# Patient Record
Sex: Male | Born: 1995 | Race: Black or African American | Hispanic: No | Marital: Single | Smoking: Never smoker
Health system: Southern US, Community
[De-identification: ages and names within clinical notes are randomized; demographics above are authoritative.]

## PROBLEM LIST (undated history)

## (undated) DIAGNOSIS — J45909 Unspecified asthma, uncomplicated: Secondary | ICD-10-CM

## (undated) HISTORY — PX: HERNIA REPAIR: SHX51

---

## 2017-07-12 ENCOUNTER — Emergency Department (HOSPITAL_COMMUNITY)
Admission: EM | Admit: 2017-07-12 | Discharge: 2017-07-12 | Disposition: A | Discharge status: Home / Self Care | Payer: BLUE CROSS/BLUE SHIELD | Attending: Emergency Medicine | Admitting: Emergency Medicine

## 2017-07-12 ENCOUNTER — Emergency Department (HOSPITAL_COMMUNITY): Payer: BLUE CROSS/BLUE SHIELD

## 2017-07-12 ENCOUNTER — Encounter (HOSPITAL_COMMUNITY): Payer: Self-pay | Admitting: Emergency Medicine

## 2017-07-12 DIAGNOSIS — N200 Calculus of kidney: Secondary | ICD-10-CM | POA: Insufficient documentation

## 2017-07-12 DIAGNOSIS — N201 Calculus of ureter: Secondary | ICD-10-CM | POA: Diagnosis not present

## 2017-07-12 DIAGNOSIS — R110 Nausea: Secondary | ICD-10-CM | POA: Diagnosis not present

## 2017-07-12 DIAGNOSIS — N23 Unspecified renal colic: Secondary | ICD-10-CM

## 2017-07-12 DIAGNOSIS — R109 Unspecified abdominal pain: Secondary | ICD-10-CM

## 2017-07-12 DIAGNOSIS — R319 Hematuria, unspecified: Secondary | ICD-10-CM | POA: Insufficient documentation

## 2017-07-12 DIAGNOSIS — R1031 Right lower quadrant pain: Secondary | ICD-10-CM | POA: Diagnosis present

## 2017-07-12 HISTORY — DX: Unspecified asthma, uncomplicated: J45.909

## 2017-07-12 LAB — URINALYSIS, ROUTINE W REFLEX MICROSCOPIC
Bacteria, UA: NONE SEEN
Bilirubin Urine: NEGATIVE
GLUCOSE, UA: NEGATIVE mg/dL
Ketones, ur: NEGATIVE mg/dL
Leukocytes, UA: NEGATIVE
Nitrite: NEGATIVE
Protein, ur: 100 mg/dL — AB
Specific Gravity, Urine: 1.026 (ref 1.005–1.030)
pH: 6 (ref 5.0–8.0)

## 2017-07-12 MED ORDER — HYDROCODONE-ACETAMINOPHEN 5-325 MG PO TABS
1.0000 | ORAL_TABLET | Freq: Once | ORAL | Status: AC
  Administered 2017-07-12: 1 via ORAL
  Filled 2017-07-12: qty 1

## 2017-07-12 MED ORDER — ONDANSETRON 4 MG PO TBDP: 4.0000 mg | ORAL_TABLET | Freq: Three times a day (TID) | ORAL | 0 refills | Status: AC | PRN

## 2017-07-12 MED ORDER — NAPROXEN 250 MG PO TABS: 250.0000 mg | ORAL_TABLET | Freq: Two times a day (BID) | ORAL | 0 refills | Status: AC

## 2017-07-12 MED ORDER — ONDANSETRON 8 MG PO TBDP
8.0000 mg | ORAL_TABLET | Freq: Once | ORAL | Status: AC
  Administered 2017-07-12: 8 mg via ORAL
  Filled 2017-07-12: qty 1

## 2017-07-12 MED ORDER — HYDROCODONE-ACETAMINOPHEN 5-325 MG PO TABS: ORAL_TABLET | ORAL | 0 refills | Status: AC

## 2017-07-12 NOTE — ED Provider Notes (Signed)
[facility]-EMERGENCY DEPT Provider Note   CSN: [id number] Arrival date & time: 07/12/17  0736     History   Chief Complaint Chief Complaint  Patient presents with  . Flank Pain    HPI Joseph Irwin is a 22 y.o. male.  HPI Pt was seen at 0955. Per pt, c/o sudden onset and resolution of one episode of right sided flank "pain" that began this morning at 0500 PTA.  Pt describes the pain as "sharp," and radiating into the right side of his abd and groin areas.  Has been associated with nausea and hematuria. Pt took an ibuprofen with partial improvement of his pain. Denies testicular pain/swelling, no dysuria/hematuria, no abd pain, no vomiting/diarrhea, no black or blood in stools, no CP/SOB, no fevers, no rash.    Past Medical History:  Diagnosis Date  . Asthma     There are no active problems to display for this patient.   Past Surgical History:  Procedure Laterality Date  . HERNIA REPAIR          Home Medications    Prior to Admission medications   Not on File    Family History No family history on file.  Social History Social History   Tobacco Use  . Smoking status: Never Smoker  . Smokeless tobacco: Never Used  Substance Use Topics  . Alcohol use: Not Currently    Frequency: Never  . Drug use: Never     Allergies   Penicillins   Review of Systems Review of Systems ROS: Statement: All systems negative except as marked or noted in the HPI; Constitutional: Negative for fever and chills. ; ; Eyes: Negative for eye pain, redness and discharge. ; ; ENMT: Negative for ear pain, hoarseness, nasal congestion, sinus pressure and sore throat. ; ; Cardiovascular: Negative for chest pain, palpitations, diaphoresis, dyspnea and peripheral edema. ; ; Respiratory: Negative for cough, wheezing and stridor. ; ; Gastrointestinal: Negative for nausea, vomiting, diarrhea, abdominal pain, blood in stool, hematemesis, jaundice and rectal bleeding. . ;  ; Genitourinary: Negative for dysuria, +flank pain and hematuria. ; ; Genital:  No penile drainage or rash, no testicular pain or swelling, no scrotal rash or swelling. ;; Musculoskeletal: Negative for back pain and neck pain. Negative for swelling and trauma.; ; Skin: Negative for pruritus, rash, abrasions, blisters, bruising and skin lesion.; ; Neuro: Negative for headache, lightheadedness and neck stiffness. Negative for weakness, altered level of consciousness, altered mental status, extremity weakness, paresthesias, involuntary movement, seizure and syncope.       Physical Exam Updated Vital Signs BP 128/84 (BP Location: Right Arm)   Pulse 74   Temp 98 F (36.7 C) (Oral)   Ht 5\' 10"  (1.778 m)   Wt 74.8 kg (165 lb)   SpO2 100%   BMI 23.68 kg/m   Physical Exam 1000: Physical examination:  Nursing notes reviewed; Vital signs and O2 SAT reviewed;  Constitutional: Well developed, Well nourished, Well hydrated, In no acute distress; Head:  Normocephalic, atraumatic; Eyes: EOMI, PERRL, No scleral icterus; ENMT: Mouth and pharynx normal, Mucous membranes moist; Neck: Supple, Full range of motion, No lymphadenopathy; Cardiovascular: Regular rate and rhythm, No gallop; Respiratory: Breath sounds clear & equal bilaterally, No wheezes.  Speaking full sentences with ease, Normal respiratory effort/excursion; Chest: Nontender, Movement normal; Abdomen: Soft, Nontender, Nondistended, Normal bowel sounds; Genitourinary: No CVA tenderness. Genital exam performed with pt permission and ED RN chaperone present during exam. No perineal erythema.  No penile lesions  or drainage.  No scrotal erythema, edema or tenderness to palp.  Normal testicular lie.  No testicular tenderness to palp.  +cremasteric reflexes bilat.  No inguinal LAN or palpable masses.;;; Spine:  No midline CS, TS, LS tenderness.;; Extremities: Peripheral pulses normal, No tenderness, No edema, No calf edema or asymmetry.; Neuro: AA&Ox3, Major CN  grossly intact.  Speech clear. No gross focal motor or sensory deficits in extremities. Climbs on and off stretcher easily by himself. Gait steady..; Skin: Color normal, Warm, Dry.   ED Treatments / Results  Labs (all labs ordered are listed, but only abnormal results are displayed)   EKG None  Radiology   Procedures Procedures (including critical care time)  Medications Ordered in ED Medications  ondansetron (ZOFRAN-ODT) disintegrating tablet 8 mg (has no administration in time range)  HYDROcodone-acetaminophen (NORCO/VICODIN) 5-325 MG per tablet 1 tablet (has no administration in time range)     Initial Impression / Assessment and Plan / ED Course  I have reviewed the triage vital signs and the nursing notes.  Pertinent labs & imaging results that were available during my care of the patient were reviewed by me and considered in my medical decision making (see chart for details).  MDM Reviewed: previous chart, nursing note and vitals Interpretation: labs and CT scan    Results for orders placed or performed during the hospital encounter of 07/12/17  Urinalysis, Routine w reflex microscopic- may I&O cath if menses  Result Value Ref Range   Color, Urine RED (A) YELLOW   APPearance CLOUDY (A) CLEAR   Specific Gravity, Urine 1.026 1.005 - 1.030   pH 6.0 5.0 - 8.0   Glucose, UA NEGATIVE NEGATIVE mg/dL   Hgb urine dipstick MODERATE (A) NEGATIVE   Bilirubin Urine NEGATIVE NEGATIVE   Ketones, ur NEGATIVE NEGATIVE mg/dL   Protein, ur 098 (A) NEGATIVE mg/dL   Nitrite NEGATIVE NEGATIVE   Leukocytes, UA NEGATIVE NEGATIVE   RBC / HPF TOO NUMEROUS TO COUNT 0 - 5 RBC/hpf   WBC, UA 6-30 0 - 5 WBC/hpf   Bacteria, UA NONE SEEN NONE SEEN   Squamous Epithelial / LPF 0-5 (A) NONE SEEN   Mucus PRESENT    Ct Renal Stone Study Result Date: 07/12/2017 CLINICAL DATA:  Severe right-sided flank pain, dark urine EXAM: CT ABDOMEN AND PELVIS WITHOUT CONTRAST TECHNIQUE: Multidetector CT  imaging of the abdomen and pelvis was performed following the standard protocol without IV contrast. COMPARISON:  None. FINDINGS: Lower chest: The lung bases are clear. The heart is within normal limits in size. Hepatobiliary: The liver is unremarkable in the unenhanced state. No calcified gallstones are noted. Pancreas: Pancreas is normal in size and the pancreatic duct is not dilated. Spleen: The spleen is unremarkable. Adrenals/Urinary Tract: The adrenal glands appear normal. On coronal images there is a single 2 mm nonobstructing lower right renal calculus present. No definite left renal calculi are seen. The ureters do not appear to be dilated. However, within the mid proximal right ureter there is a 3 mm calculus present. The distal ureters also are normal in caliber. The urinary bladder is not well distended but no abnormality is seen. Stomach/Bowel: The stomach is only slightly distended with fluid. No abnormality is seen. No abnormality of small bowel is noted. There are scattered rectosigmoid and distal descending colon diverticula present but no diverticulitis is noted. There are a few diverticula scattered throughout the more proximal colon as well. The terminal ileum is unremarkable, as is the appendix. Vascular/Lymphatic: The  abdominal aorta is normal in caliber. No adenopathy is seen. Reproductive: The prostate is within normal limits in size for age. Other: A small periumbilical hernia containing only fat is noted. Musculoskeletal: There is very slight anterolisthesis of L5 on S1 by approximately 4 mm with bilateral pars defects noted at L5. Intervertebral disc spaces appear normal. The SI joints are corticated. IMPRESSION: 1. Nonobstructing 3 mm proximal right ureteral calculus. 2. 2 mm nonobstructing right lower pole renal calculus. No definite left renal calculi. 3. Scattered colonic diverticula primarily in the rectosigmoid colon. 4. 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 Electronically Signed   By: Dwyane Dee M.D.   On: 07/12/2017 10:41     1145:  Pt has tol PO well while in the ED without N/V. Abd remains benign, VSS. Tx symptomatically.  Feels better and wants to go home now. Dx and testing d/w pt and family.  Questions answered.  Verb understanding, agreeable to d/c home with outpt f/u.     Final Clinical Impressions(s) / ED Diagnoses   Final diagnoses:  None    ED Discharge Orders    None       Samuel Jester, DO 07/16/17 0902

## 2017-07-12 NOTE — ED Triage Notes (Signed)
Per PT[facility] pt c/o right sided flank pain radiating into groin area and hematuria.

## 2017-07-12 NOTE — ED Notes (Signed)
Patient given water and a [place] sandwich. Tolerating both well.

## 2017-07-12 NOTE — Discharge Instructions (Signed)
Take the prescriptions as directed. Call the Urologist today to schedule a follow up appointment within the next week.  Return to the Emergency Department immediately if worsening. ° °

## 2017-07-12 NOTE — ED Notes (Signed)
ED Provider at bedside. 

## 2017-07-13 LAB — URINE CULTURE: Culture: NO GROWTH

## 2018-12-17 IMAGING — CT CT RENAL STONE PROTOCOL
2 of 4 series · 16 of 46 positions shown, 18 images · non-contrast
Comparison: None.

CLINICAL DATA: Severe right-sided flank pain, dark urine

EXAM:
CT ABDOMEN AND PELVIS WITHOUT CONTRAST
TECHNIQUE: Multidetector CT imaging of the abdomen and pelvis was performed
following the standard protocol without IV contrast.

[Series 2: axial st · axial · 0.69mm/px · z∈[-385,-5]mm · 13 of 86 slices shown, 15 images]
[im 5/86  soft-tissue]
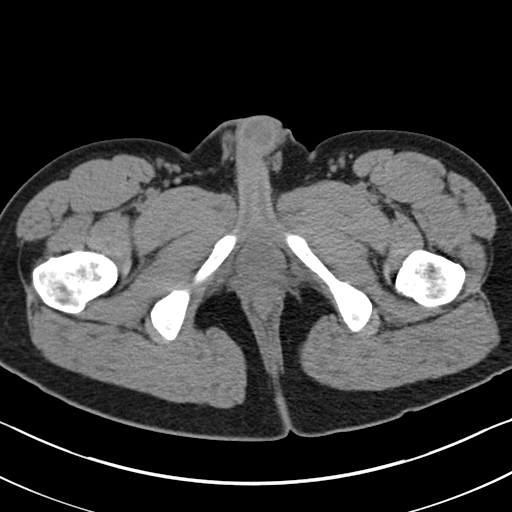
[im 5/86  bone]
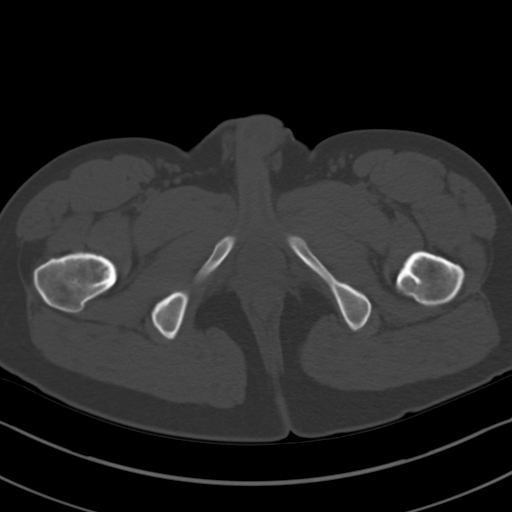
[im 14/86  soft-tissue]
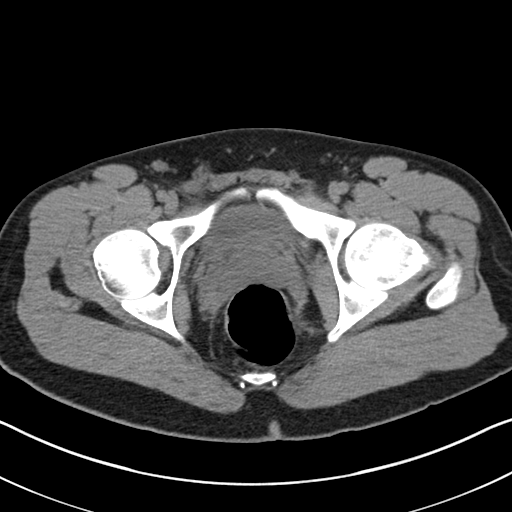
[im 18/86  soft-tissue]
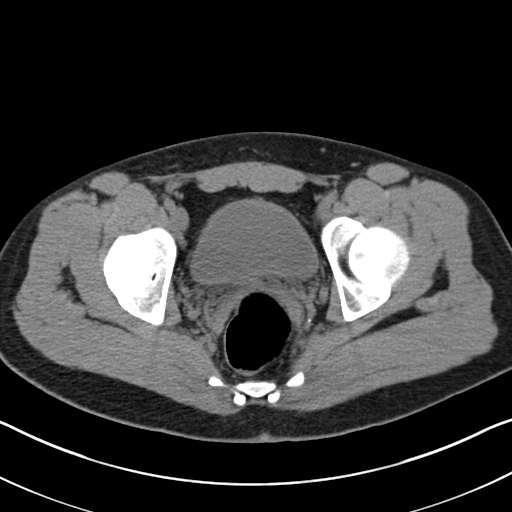
[im 23/86  soft-tissue]
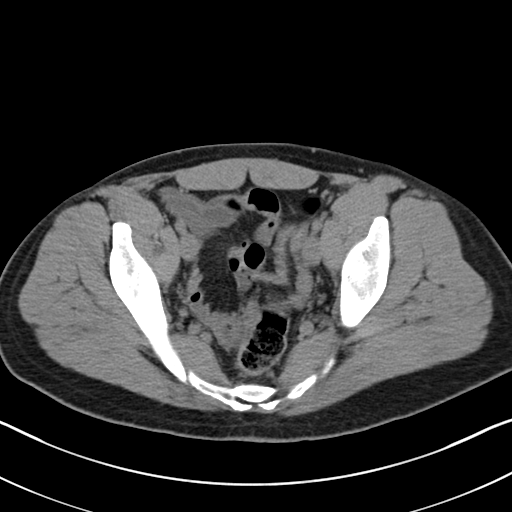
[im 32/86  soft-tissue]
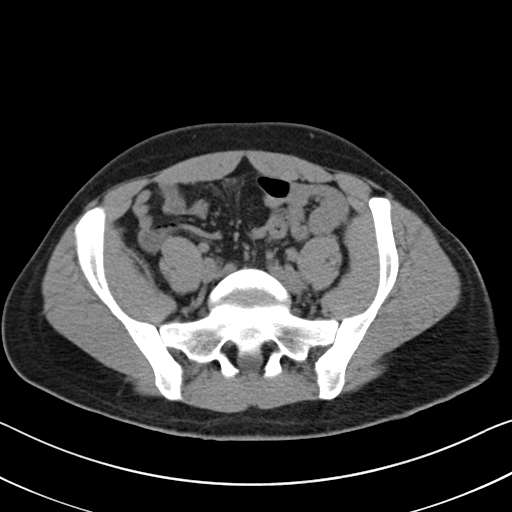
[im 36/86  soft-tissue]
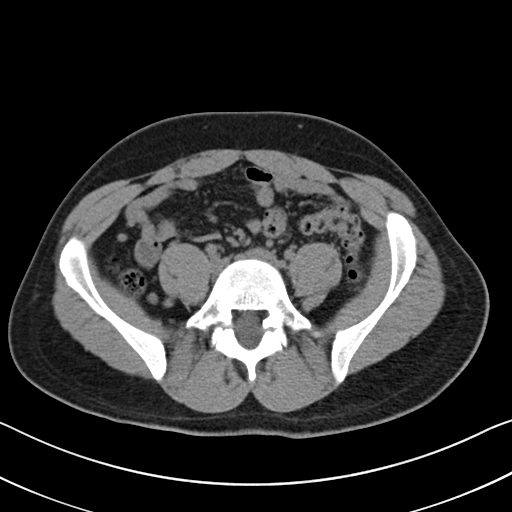
[im 45/86  soft-tissue]
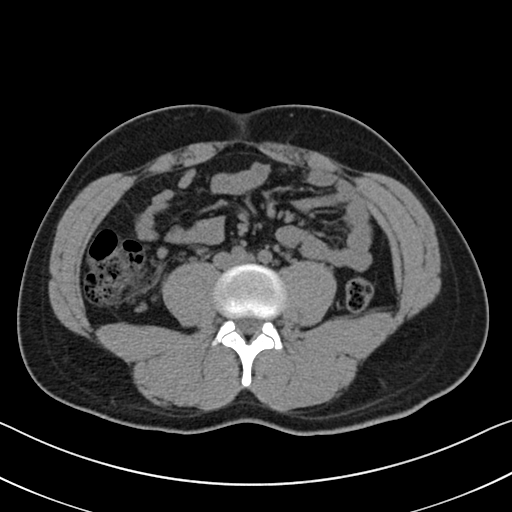
[im 50/86  soft-tissue]
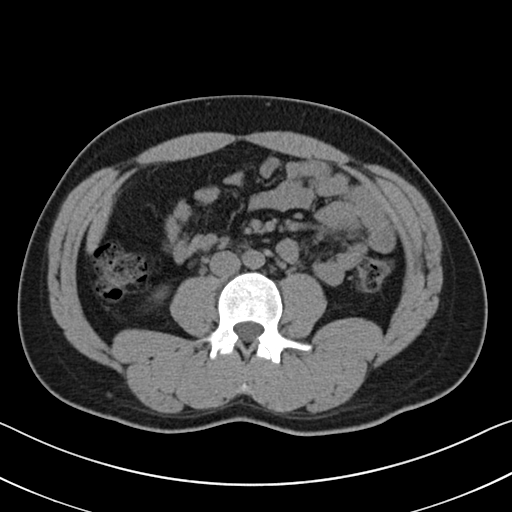
[im 54/86  soft-tissue]
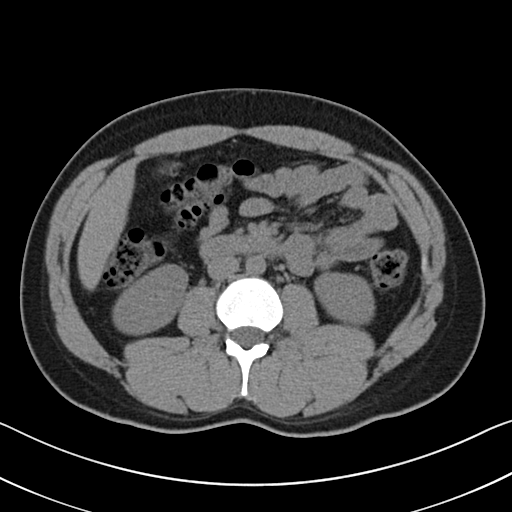
[im 54/86  bone]
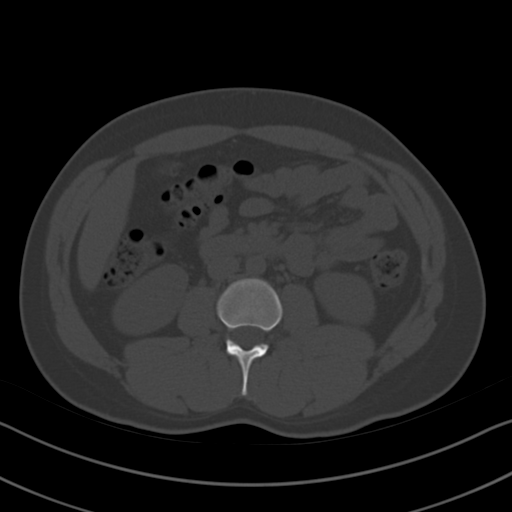
[im 63/86  soft-tissue]
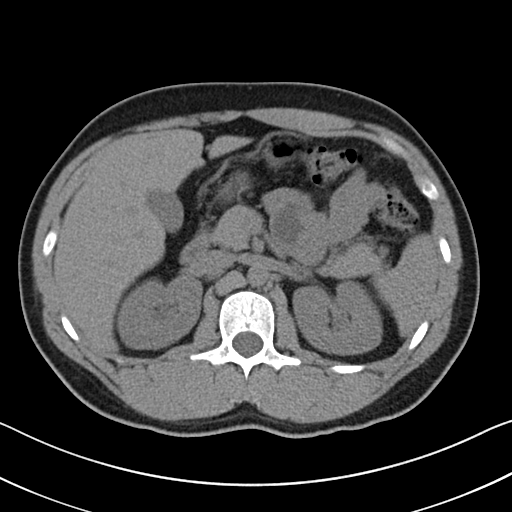
[im 68/86  soft-tissue]
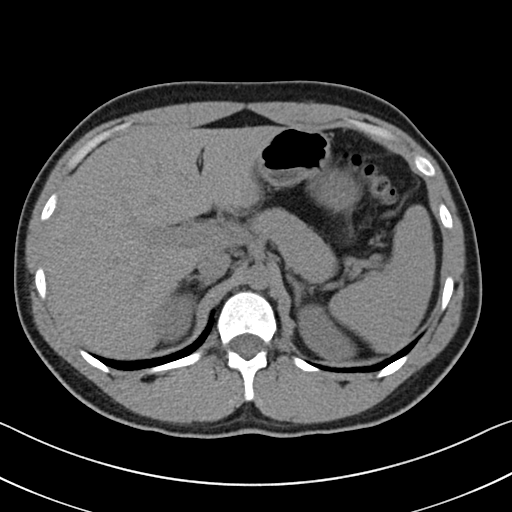
[im 72/86  soft-tissue]
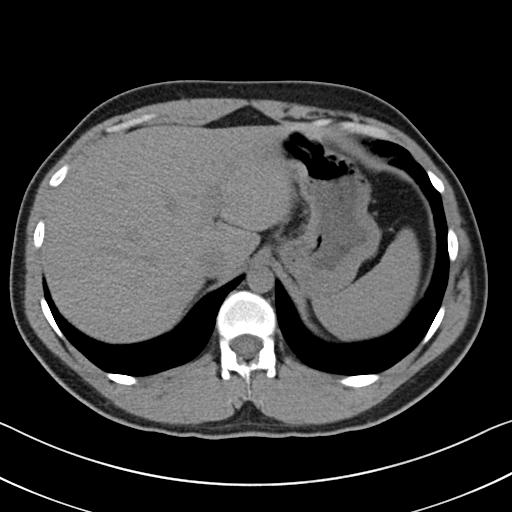
[im 81/86  soft-tissue]
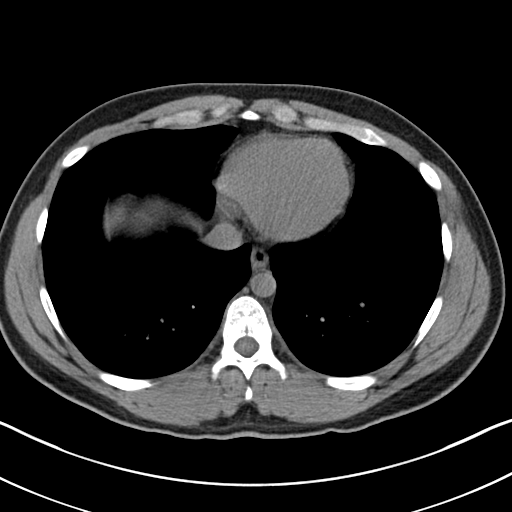

[Series 5: coronal · coronal · 0.74mm/px · 3 of 128 slices shown]
[im 43/128  soft-tissue]
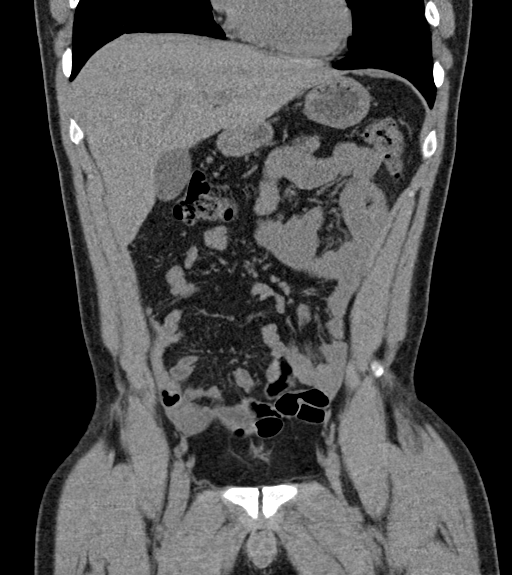
[im 57/128  soft-tissue]
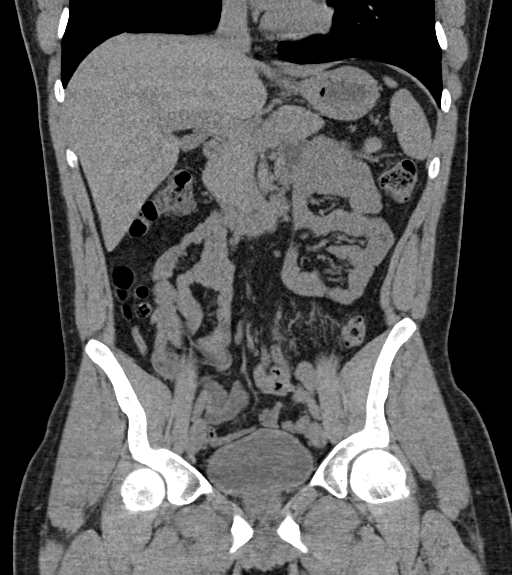
[im 71/128  soft-tissue]
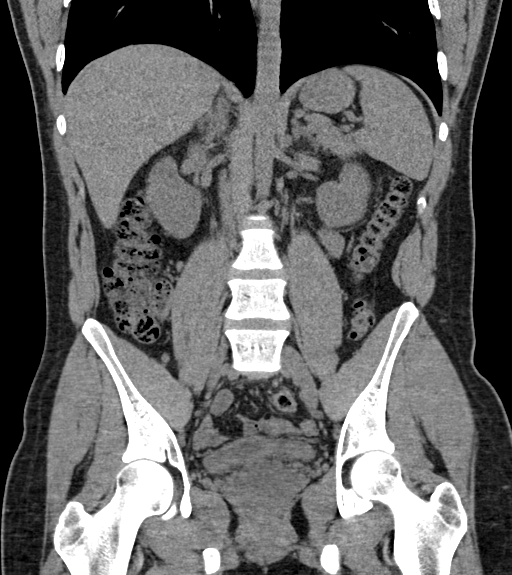

[16 of 46 positions shown; findings below may reference images not displayed]

FINDINGS: Lower chest: The lung bases are clear. The heart is within normal
limits in size.

Hepatobiliary: The liver is unremarkable in the unenhanced state. No
calcified gallstones are noted.

Pancreas: Pancreas is normal in size and the pancreatic duct is not
dilated.

Spleen: The spleen is unremarkable.

Adrenals/Urinary Tract: The adrenal glands appear normal. On coronal
images there is a single 2 mm nonobstructing lower right renal
calculus present. No definite left renal calculi are seen. The
ureters do not appear to be dilated. However, within the mid
proximal right ureter there is a 3 mm calculus present. The distal
ureters also are normal in caliber. The urinary bladder is not well
distended but no abnormality is seen.

Stomach/Bowel: The stomach is only slightly distended with fluid. No
abnormality is seen. No abnormality of small bowel is noted. There
are scattered rectosigmoid and distal descending colon diverticula
present but no diverticulitis is noted. There are a few diverticula
scattered throughout the more proximal colon as well. The terminal
ileum is unremarkable, as is the appendix.

Vascular/Lymphatic: The abdominal aorta is normal in caliber. No
adenopathy is seen.

Reproductive: The prostate is within normal limits in size for age.

Other: A small periumbilical hernia containing only fat is noted.

Musculoskeletal: There is very slight anterolisthesis of L5 on S1 by
approximately 4 mm with bilateral pars defects noted at L5.
Intervertebral disc spaces appear normal. The SI joints are
corticated.
IMPRESSION: 1. Nonobstructing 3 mm proximal right ureteral calculus.
2. 2 mm nonobstructing right lower pole renal calculus. No definite
left renal calculi.
3. Scattered colonic diverticula primarily in the rectosigmoid
colon.
4. Bilateral pars defects at L5 with slight 4 mm anterolisthesis of
L5 on S1.
